# Patient Record
Sex: Female | Born: 1985 | Race: White | Hispanic: No | Marital: Single | Smoking: Current every day smoker
Health system: Southern US, Community
[De-identification: ages and names within clinical notes are randomized; demographics above are authoritative.]

## PROBLEM LIST (undated history)

## (undated) DIAGNOSIS — Z789 Other specified health status: Secondary | ICD-10-CM

## (undated) HISTORY — PX: NO PAST SURGERIES: SHX2092

---
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
## 2010-05-22 NOTE — L&D Delivery Note (Signed)
Delivery Note Pt progressed rapidly to complete dilation and pushed great .  At 7:30 PM a viable female was delivered via Vaginal, Spontaneous Delivery (Presentation: ; Occiput Anterior).  APGAR: 8, 9; weight 4 lb 11.3 oz (2135 g).   Placenta status: Intact, Spontaneous.  Cord: 3 vessels with the following complications: Nuchal x 1 reduced over head.  Placenta sent to path for IUGR baby.  The patient developed a temperature to about 101 in labor so will receive Unasyn for 3 doses.  Anesthesia: Epidural  Episiotomy: None Lacerations: 1st degree;Perineal Suture Repair: 3.0 vicryl rapide Est. Blood Loss (mL): 350cc  Mom to postpartum.  Baby to nursery-stable.  Oliver Pila 05/20/2011, 8:26 PM

## 2010-08-01 ENCOUNTER — Other Ambulatory Visit: Payer: Self-pay | Admitting: Obstetrics and Gynecology

## 2010-08-01 ENCOUNTER — Inpatient Hospital Stay (HOSPITAL_COMMUNITY)
Admission: AD | Admit: 2010-08-01 | Discharge: 2010-08-01 | Disposition: A | Discharge status: Home / Self Care | Payer: Medicaid Other | Source: Ambulatory Visit | Attending: Obstetrics and Gynecology | Admitting: Obstetrics and Gynecology

## 2010-08-01 DIAGNOSIS — O039 Complete or unspecified spontaneous abortion without complication: Secondary | ICD-10-CM | POA: Insufficient documentation

## 2010-08-01 LAB — CBC
MCH: 30 pg (ref 26.0–34.0)
MCHC: 34 g/dL (ref 30.0–36.0)
Platelets: 300 10*3/uL (ref 150–400)
RDW: 12.5 % (ref 11.5–15.5)

## 2011-05-20 ENCOUNTER — Encounter (HOSPITAL_COMMUNITY): Payer: Self-pay | Admitting: Anesthesiology

## 2011-05-20 ENCOUNTER — Inpatient Hospital Stay (HOSPITAL_COMMUNITY)
Admission: AD | Admit: 2011-05-20 | Discharge: 2011-05-22 | DRG: 774 | Disposition: A | Discharge status: Home / Self Care | Payer: Medicaid Other | Source: Ambulatory Visit | Attending: Obstetrics and Gynecology | Admitting: Obstetrics and Gynecology

## 2011-05-20 ENCOUNTER — Encounter (HOSPITAL_COMMUNITY): Payer: Self-pay | Admitting: *Deleted

## 2011-05-20 ENCOUNTER — Inpatient Hospital Stay (HOSPITAL_COMMUNITY): Payer: Medicaid Other | Admitting: Anesthesiology

## 2011-05-20 DIAGNOSIS — O365990 Maternal care for other known or suspected poor fetal growth, unspecified trimester, not applicable or unspecified: Secondary | ICD-10-CM | POA: Diagnosis present

## 2011-05-20 HISTORY — DX: Other specified health status: Z78.9

## 2011-05-20 LAB — RUBELLA ANTIBODY, IGM: Rubella: IMMUNE

## 2011-05-20 LAB — COMPREHENSIVE METABOLIC PANEL
ALT: 7 U/L (ref 0–35)
AST: 12 U/L (ref 0–37)
Albumin: 2.5 g/dL — ABNORMAL LOW (ref 3.5–5.2)
Calcium: 8.7 mg/dL (ref 8.4–10.5)
Chloride: 105 mEq/L (ref 96–112)
Creatinine, Ser: 0.43 mg/dL — ABNORMAL LOW (ref 0.50–1.10)
Sodium: 136 mEq/L (ref 135–145)
Total Bilirubin: 0.2 mg/dL — ABNORMAL LOW (ref 0.3–1.2)

## 2011-05-20 LAB — ABO/RH

## 2011-05-20 LAB — HEPATITIS B SURFACE ANTIGEN: Hepatitis B Surface Ag: NEGATIVE

## 2011-05-20 LAB — CBC
Hemoglobin: 11.5 g/dL — ABNORMAL LOW (ref 12.0–15.0)
MCH: 30.5 pg (ref 26.0–34.0)
MCV: 91 fL (ref 78.0–100.0)
RBC: 3.77 MIL/uL — ABNORMAL LOW (ref 3.87–5.11)

## 2011-05-20 LAB — URIC ACID: Uric Acid, Serum: 5.3 mg/dL (ref 2.4–7.0)

## 2011-05-20 MED ORDER — OXYTOCIN 20 UNITS IN LACTATED RINGERS INFUSION - SIMPLE
125.0000 mL/h | Freq: Once | INTRAVENOUS | Status: AC
  Administered 2011-05-20: 125 mL/h via INTRAVENOUS
  Filled 2011-05-20: qty 1000

## 2011-05-20 MED ORDER — IBUPROFEN 600 MG PO TABS
600.0000 mg | ORAL_TABLET | Freq: Four times a day (QID) | ORAL | Status: DC
  Administered 2011-05-21 – 2011-05-22 (×8): 600 mg via ORAL
  Filled 2011-05-20 (×8): qty 1

## 2011-05-20 MED ORDER — LACTATED RINGERS IV SOLN
500.0000 mL | INTRAVENOUS | Status: DC | PRN
  Administered 2011-05-20: 1000 mL via INTRAVENOUS
  Administered 2011-05-20 (×2): 500 mL via INTRAVENOUS

## 2011-05-20 MED ORDER — SODIUM BICARBONATE 8.4 % IV SOLN
INTRAVENOUS | Status: DC | PRN
  Administered 2011-05-20: 4 mL via EPIDURAL

## 2011-05-20 MED ORDER — SENNOSIDES-DOCUSATE SODIUM 8.6-50 MG PO TABS
2.0000 | ORAL_TABLET | Freq: Every day | ORAL | Status: DC
  Administered 2011-05-21: 2 via ORAL

## 2011-05-20 MED ORDER — LIDOCAINE HCL (PF) 1 % IJ SOLN
30.0000 mL | INTRAMUSCULAR | Status: DC | PRN
  Administered 2011-05-20: 30 mL via SUBCUTANEOUS
  Filled 2011-05-20: qty 30

## 2011-05-20 MED ORDER — LANOLIN HYDROUS EX OINT: TOPICAL_OINTMENT | CUTANEOUS | Status: DC | PRN

## 2011-05-20 MED ORDER — TETANUS-DIPHTH-ACELL PERTUSSIS 5-2.5-18.5 LF-MCG/0.5 IM SUSP
0.5000 mL | Freq: Once | INTRAMUSCULAR | Status: AC
  Administered 2011-05-21: 0.5 mL via INTRAMUSCULAR
  Filled 2011-05-20: qty 0.5

## 2011-05-20 MED ORDER — DIPHENHYDRAMINE HCL 25 MG PO CAPS: 25.0000 mg | ORAL_CAPSULE | Freq: Four times a day (QID) | ORAL | Status: DC | PRN

## 2011-05-20 MED ORDER — ONDANSETRON HCL 4 MG/2ML IJ SOLN: 4.0000 mg | INTRAMUSCULAR | Status: DC | PRN

## 2011-05-20 MED ORDER — OXYCODONE-ACETAMINOPHEN 5-325 MG PO TABS
2.0000 | ORAL_TABLET | ORAL | Status: DC | PRN
  Administered 2011-05-20: 1 via ORAL
  Filled 2011-05-20: qty 1

## 2011-05-20 MED ORDER — ONDANSETRON HCL 4 MG PO TABS: 4.0000 mg | ORAL_TABLET | ORAL | Status: DC | PRN

## 2011-05-20 MED ORDER — OXYTOCIN BOLUS FROM INFUSION
500.0000 mL | Freq: Once | INTRAVENOUS | Status: DC
  Filled 2011-05-20: qty 500

## 2011-05-20 MED ORDER — PHENYLEPHRINE 40 MCG/ML (10ML) SYRINGE FOR IV PUSH (FOR BLOOD PRESSURE SUPPORT): 80.0000 ug | PREFILLED_SYRINGE | INTRAVENOUS | Status: DC | PRN

## 2011-05-20 MED ORDER — FENTANYL 2.5 MCG/ML BUPIVACAINE 1/10 % EPIDURAL INFUSION (WH - ANES)
14.0000 mL/h | INTRAMUSCULAR | Status: DC
  Administered 2011-05-20 (×2): 14 mL/h via EPIDURAL
  Filled 2011-05-20 (×3): qty 60

## 2011-05-20 MED ORDER — DIBUCAINE 1 % RE OINT: 1.0000 "application " | TOPICAL_OINTMENT | RECTAL | Status: DC | PRN

## 2011-05-20 MED ORDER — TERBUTALINE SULFATE 1 MG/ML IJ SOLN: 0.2500 mg | Freq: Once | INTRAMUSCULAR | Status: DC | PRN

## 2011-05-20 MED ORDER — ACETAMINOPHEN 325 MG PO TABS
650.0000 mg | ORAL_TABLET | ORAL | Status: DC | PRN
  Administered 2011-05-20: 650 mg via ORAL
  Filled 2011-05-20: qty 2

## 2011-05-20 MED ORDER — LACTATED RINGERS IV SOLN
INTRAVENOUS | Status: DC
  Administered 2011-05-20: 125 mL/h via INTRAVENOUS
  Administered 2011-05-20: 11:00:00 via INTRAVENOUS
  Administered 2011-05-20: 125 mL/h via INTRAVENOUS

## 2011-05-20 MED ORDER — PHENYLEPHRINE 40 MCG/ML (10ML) SYRINGE FOR IV PUSH (FOR BLOOD PRESSURE SUPPORT)
80.0000 ug | PREFILLED_SYRINGE | INTRAVENOUS | Status: DC | PRN
  Filled 2011-05-20: qty 5

## 2011-05-20 MED ORDER — WITCH HAZEL-GLYCERIN EX PADS: 1.0000 "application " | MEDICATED_PAD | CUTANEOUS | Status: DC | PRN

## 2011-05-20 MED ORDER — EPHEDRINE 5 MG/ML INJ
10.0000 mg | INTRAVENOUS | Status: DC | PRN
  Filled 2011-05-20: qty 4

## 2011-05-20 MED ORDER — BENZOCAINE-MENTHOL 20-0.5 % EX AERO
1.0000 "application " | INHALATION_SPRAY | CUTANEOUS | Status: DC | PRN
  Administered 2011-05-21: 1 via TOPICAL

## 2011-05-20 MED ORDER — EPHEDRINE 5 MG/ML INJ: 10.0000 mg | INTRAVENOUS | Status: DC | PRN

## 2011-05-20 MED ORDER — OXYCODONE-ACETAMINOPHEN 5-325 MG PO TABS
1.0000 | ORAL_TABLET | ORAL | Status: DC | PRN
  Administered 2011-05-22 (×2): 1 via ORAL
  Filled 2011-05-20 (×2): qty 1

## 2011-05-20 MED ORDER — PRENATAL MULTIVITAMIN CH
1.0000 | ORAL_TABLET | Freq: Every day | ORAL | Status: DC
  Administered 2011-05-21 – 2011-05-22 (×2): 1 via ORAL
  Filled 2011-05-20 (×2): qty 1

## 2011-05-20 MED ORDER — ZOLPIDEM TARTRATE 5 MG PO TABS: 5.0000 mg | ORAL_TABLET | Freq: Every evening | ORAL | Status: DC | PRN

## 2011-05-20 MED ORDER — FENTANYL 2.5 MCG/ML BUPIVACAINE 1/10 % EPIDURAL INFUSION (WH - ANES)
INTRAMUSCULAR | Status: DC | PRN
  Administered 2011-05-20: 13 mL/h via EPIDURAL

## 2011-05-20 MED ORDER — OXYTOCIN 20 UNITS IN LACTATED RINGERS INFUSION - SIMPLE
1.0000 m[IU]/min | INTRAVENOUS | Status: DC
  Administered 2011-05-20: 2 m[IU]/min via INTRAVENOUS

## 2011-05-20 MED ORDER — SODIUM CHLORIDE 0.9 % IV SOLN
3.0000 g | Freq: Four times a day (QID) | INTRAVENOUS | Status: AC
  Administered 2011-05-20 – 2011-05-21 (×3): 3 g via INTRAVENOUS
  Filled 2011-05-20 (×3): qty 3

## 2011-05-20 MED ORDER — CITRIC ACID-SODIUM CITRATE 334-500 MG/5ML PO SOLN: 30.0000 mL | ORAL | Status: DC | PRN

## 2011-05-20 MED ORDER — LACTATED RINGERS IV SOLN
500.0000 mL | Freq: Once | INTRAVENOUS | Status: AC
  Administered 2011-05-20: 500 mL via INTRAVENOUS

## 2011-05-20 MED ORDER — SODIUM CHLORIDE 0.9 % IJ SOLN
INTRAMUSCULAR | Status: AC
  Filled 2011-05-20: qty 3

## 2011-05-20 MED ORDER — FLEET ENEMA 7-19 GM/118ML RE ENEM: 1.0000 | ENEMA | RECTAL | Status: DC | PRN

## 2011-05-20 MED ORDER — ONDANSETRON HCL 4 MG/2ML IJ SOLN
4.0000 mg | Freq: Four times a day (QID) | INTRAMUSCULAR | Status: DC | PRN
  Administered 2011-05-20: 4 mg via INTRAVENOUS
  Filled 2011-05-20: qty 2

## 2011-05-20 MED ORDER — SIMETHICONE 80 MG PO CHEW: 80.0000 mg | CHEWABLE_TABLET | ORAL | Status: DC | PRN

## 2011-05-20 MED ORDER — DIPHENHYDRAMINE HCL 50 MG/ML IJ SOLN: 12.5000 mg | INTRAMUSCULAR | Status: DC | PRN

## 2011-05-20 MED ORDER — IBUPROFEN 600 MG PO TABS
600.0000 mg | ORAL_TABLET | Freq: Four times a day (QID) | ORAL | Status: DC | PRN
  Administered 2011-05-20: 600 mg via ORAL
  Filled 2011-05-20: qty 1

## 2011-05-20 NOTE — Progress Notes (Signed)
   Subjective: Uncomfortable but in control  Objective: BP 112/64  Pulse 79  Temp(Src) 97.8 F (36.6 C) (Oral)  Resp 18  Ht 5\' 2"  (1.575 m)  Wt 77.747 kg (171 lb 6.4 oz)  BMI 31.35 kg/m2      FHT:  FHR: 140 bpm, variability: moderate,  accelerations:  Present,  decelerations:  Absent UC:   regular, every 5 minutes SVE:   Dilation: 3 Effacement (%): 80 Station: -1 Exam by:: Dr. Senaida Ores AROM clear  Labs: Lab Results  Component Value Date   WBC 17.0* 05/20/2011   HGB 11.5* 05/20/2011   HCT 34.3* 05/20/2011   MCV 91.0 05/20/2011   PLT 225 05/20/2011    Assessment / Plan: Just made it to L&D, awaiting epidural Will augment with pitocin as needed FHR category I currently. Valerie Villarreal 05/20/2011, 11:58 AM

## 2011-05-20 NOTE — Progress Notes (Signed)
Ok not to restart pitocin

## 2011-05-20 NOTE — Progress Notes (Signed)
G2P0 at 39.2wks. Ctxs since 2000. Ctxs stronger and closer as night has gone by. Some bloody show.

## 2011-05-20 NOTE — Progress Notes (Signed)
   Subjective: Comfortable with epidural  Objective: BP 124/78  Pulse 85  Temp(Src) 98.5 F (36.9 C) (Oral)  Resp 16  Ht 5\' 2"  (1.575 m)  Wt 77.747 kg (171 lb 6.4 oz)  BMI 31.35 kg/m2  SpO2 100%      FHT:  FHR: 135 bpm, variability: moderate,  accelerations:  Present,  decelerations:  Present variable decels with contractions UC:   regular, every 3 minutes SVE:  90/5/0  Labs: Lab Results  Component Value Date   WBC 17.0* 05/20/2011   HGB 11.5* 05/20/2011   HCT 34.3* 05/20/2011   MCV 91.0 05/20/2011   PLT 225 05/20/2011    Assessment / Plan: Pt doing well, beginning to progress and some variable decels noted.  IUPC and FSE placed. Oliver Pila 05/20/2011, 2:53 PM

## 2011-05-20 NOTE — Anesthesia Preprocedure Evaluation (Signed)

## 2011-05-20 NOTE — ED Notes (Deleted)
Specimen sent for pathology as ordered by Lilyan Punt NP

## 2011-05-20 NOTE — Progress Notes (Signed)
FHR accel with scalp stim.

## 2011-05-20 NOTE — Progress Notes (Signed)
Variable to 60s for 20secs. Immed recovery when turned to L side.

## 2011-05-20 NOTE — H&P (Signed)
Valerie Villarreal is a 25 y.o. female G2P0010 at 39+weeks (EDD  05/25/11 by 9week [place]) presenting for regular contractions in early labor to MAU. Pt noted to have some variable decels with contractions, that have improved with a fluid bolus and tracing now is reassuring.  Prenatal care has been otherwise uncomplicated.  Will be admitted and observed with augmentation as needed given the variable decels.  Maternal Medical History:  Reason for admission: Reason for admission: contractions.  Contractions: Onset was 6-12 hours ago.   Frequency: regular.   Perceived severity is moderate.      OB History    Grav Para Term Preterm Abortions TAB SAB Ect Mult Living   2 0 0 0 1 0 1 0 0 1     SAB at 12 weeks  2012  Past Medical History  Diagnosis Date  . No pertinent past medical history    Past Surgical History  Procedure Date  . No past surgeries    Family History: family history is not on file. Social History:  reports that she has been smoking.  She does not have any smokeless tobacco history on file. She reports that she does not drink alcohol or use illicit drugs.  ROS  Dilation: 2.5 Effacement (%): 70 Station: -1 Exam by:: Quintella Baton RNC Blood pressure 125/87, pulse 95, temperature 98.4 F (36.9 C), temperature source Oral, resp. rate 20, height 5\' 2"  (1.575 m), weight 77.747 kg (171 lb 6.4 oz). Maternal Exam:  Uterine Assessment: Contraction strength is moderate.  Contraction frequency is regular.   Abdomen: Patient reports no abdominal tenderness. Fetal presentation: vertex  Introitus: Normal vulva. Normal vagina.    Physical Exam  Constitutional: She is oriented to person, place, and time. She appears well-developed and well-nourished.  Cardiovascular: Normal rate and regular rhythm.   Respiratory: Effort normal and breath sounds normal.  GI: Soft. Bowel sounds are normal.  Genitourinary: Vagina normal and uterus normal.  Neurological: She is alert and oriented to person, place,  and time.  Psychiatric: She has a normal mood and affect. Her behavior is normal.    Prenatal labs: ABO, Rh:  O positive Antibody:  negative Rubella:  immune RPR:   NR HBsAg:   NEG HIV:   NR GBS:   Neg First trimester screen and AFP WNL One hour glucola 120 Assessment/Plan: Pt admitted in early labor, will augment as needed.   Oliver Pila 05/20/2011, 7:49 AM

## 2011-05-20 NOTE — ED Notes (Signed)
4782 Dr Senaida Ores notified of pt's admission and status. Aware of ctx pattern, sve, variables, B/Ps . Will admit to [facility]

## 2011-05-20 NOTE — Progress Notes (Signed)
Pitocin off

## 2011-05-20 NOTE — Progress Notes (Signed)
Dr. Senaida Ores updated on patients status. To give patient Tylenol per orders for fever

## 2011-05-20 NOTE — ED Notes (Signed)
2130 Up to BR.

## 2011-05-20 NOTE — Progress Notes (Signed)
Updated on fetal strip

## 2011-05-20 NOTE — Anesthesia Procedure Notes (Signed)

## 2011-05-21 ENCOUNTER — Encounter (HOSPITAL_COMMUNITY): Payer: Self-pay | Admitting: *Deleted

## 2011-05-21 LAB — CBC
HCT: 28.4 % — ABNORMAL LOW (ref 36.0–46.0)
Hemoglobin: 9.3 g/dL — ABNORMAL LOW (ref 12.0–15.0)
RDW: 14.3 % (ref 11.5–15.5)
WBC: 17.1 10*3/uL — ABNORMAL HIGH (ref 4.0–10.5)

## 2011-05-21 LAB — ABO/RH: ABO/RH(D): O POS

## 2011-05-21 MED ORDER — BENZOCAINE-MENTHOL 20-0.5 % EX AERO
INHALATION_SPRAY | CUTANEOUS | Status: AC
  Administered 2011-05-21: 1 via TOPICAL
  Filled 2011-05-21: qty 56

## 2011-05-21 NOTE — Anesthesia Postprocedure Evaluation (Signed)
  Anesthesia Post-op Note  Patient: Valerie Villarreal  Procedure(s) Performed: * No procedures listed *  Patient Location: Mother/Baby  Anesthesia Type: Epidural  Level of Consciousness: awake, alert  and oriented  Airway and Oxygen Therapy: Patient Spontanous Breathing  Post-op Pain: mild  Post-op Assessment: Patient's Cardiovascular Status Stable, Respiratory Function Stable, Patent Airway, No signs of Nausea or vomiting and Pain level controlled  Post-op Vital Signs: stable  Complications: No apparent anesthesia complications

## 2011-05-21 NOTE — Progress Notes (Signed)
Post Partum Day 1 Subjective: no complaints and tolerating PO  Baby nursing well, has had some temperature issues Objective: Blood pressure 110/71, pulse 93, temperature 98.4 F (36.9 C), temperature source Oral, resp. rate 24, height 5\' 2"  (1.575 m), weight 77.747 kg (171 lb 6.4 oz), SpO2 100.00%.  Physical Exam:  General: alert Lochia: appropriate Uterine Fundus: firm  Basename 05/21/11 0553 05/20/11 0704  HGB 9.3* 11.5*  HCT 28.4* 34.3*    Assessment/Plan: Plan for discharge tomorrow Probably will do circumcision in office Afebrile since delivery, will d/c unasyn after next dose     LOS: 1 day   Advay Volante W 05/21/2011, 6:57 AM

## 2011-05-22 MED ORDER — PNEUMOCOCCAL VAC POLYVALENT 25 MCG/0.5ML IJ INJ: 0.5000 mL | INJECTION | INTRAMUSCULAR | Status: DC

## 2011-05-22 MED ORDER — INFLUENZA VIRUS VACC SPLIT PF IM SUSP
0.5000 mL | INTRAMUSCULAR | Status: AC | PRN
  Administered 2011-05-22: 0.5 mL via INTRAMUSCULAR
  Filled 2011-05-22: qty 0.5

## 2011-05-22 MED ORDER — IBUPROFEN 600 MG PO TABS: 600.0000 mg | ORAL_TABLET | Freq: Four times a day (QID) | ORAL | Status: AC

## 2011-05-22 MED ORDER — OXYCODONE-ACETAMINOPHEN 5-325 MG PO TABS: 1.0000 | ORAL_TABLET | ORAL | Status: AC | PRN

## 2011-05-22 MED ORDER — PNEUMOCOCCAL VAC POLYVALENT 25 MCG/0.5ML IJ INJ
0.5000 mL | INJECTION | INTRAMUSCULAR | Status: AC
  Administered 2011-05-22: 0.5 mL via INTRAMUSCULAR
  Filled 2011-05-22: qty 0.5

## 2011-05-22 NOTE — Progress Notes (Signed)
UR chart review completed.  

## 2011-05-22 NOTE — Discharge Summary (Signed)
Obstetric Discharge Summary Reason for Admission: onset of labor Prenatal Procedures: none Intrapartum Procedures: spontaneous vaginal delivery Postpartum Procedures: none Complications-Operative and Postpartum: first degree perineal laceration Hemoglobin  Date Value Range Status  05/21/2011 9.3* 12.0-15.0 (g/dL) Final     REPEATED TO VERIFY     DELTA CHECK NOTED     HCT  Date Value Range Status  05/21/2011 28.4* 36.0-46.0 (%) Final    Discharge Diagnoses: Term Pregnancy-delivered                                         IUGR Discharge Information: Date: 05/22/2011 Activity: pelvic rest Diet: routine Medications: Ibuprofen and Percocet Condition: improved Instructions: refer to practice specific booklet Discharge to: home   Newborn Data: Live born female  Birth Weight: 4 lb 11.3 oz (2135 g) APGAR: 8, 9  Home with mother.  Valerie Villarreal 05/22/2011, 3:20 AM

## 2011-05-22 NOTE — Progress Notes (Signed)
Post Partum Day 2 Subjective: no complaints, voiding and tolerating PO  Objective: Blood pressure 120/74, pulse 91, temperature 97.7 F (36.5 C), temperature source Oral, resp. rate 20, height 5\' 2"  (1.575 m), weight 77.747 kg (171 lb 6.4 oz), SpO2 100.00%, unknown if currently breastfeeding.  Physical Exam:  General: alert Lochia: appropriate Uterine Fundus: firm }   Basename 05/21/11 0553 05/20/11 0704  HGB 9.3* 11.5*  HCT 28.4* 34.3*    Assessment/Plan: Discharge home Motrin,percocoet F/u 6 weeks Plan circ in office   LOS: 2 days   Dontrey Snellgrove W 05/22/2011, 3:19 AM

## 2011-05-23 ENCOUNTER — Encounter (HOSPITAL_COMMUNITY): Payer: Self-pay | Admitting: *Deleted

## 2011-05-23 ENCOUNTER — Inpatient Hospital Stay (HOSPITAL_COMMUNITY)
Admission: AD | Admit: 2011-05-23 | Discharge: 2011-05-23 | Disposition: A | Discharge status: Home / Self Care | Payer: Medicaid Other | Source: Ambulatory Visit | Attending: Obstetrics and Gynecology | Admitting: Obstetrics and Gynecology

## 2011-05-23 DIAGNOSIS — O864 Pyrexia of unknown origin following delivery: Secondary | ICD-10-CM

## 2011-05-23 LAB — URINE MICROSCOPIC-ADD ON

## 2011-05-23 LAB — CBC
MCV: 92.2 fL (ref 78.0–100.0)
Platelets: 211 10*3/uL (ref 150–400)
RBC: 3.32 MIL/uL — ABNORMAL LOW (ref 3.87–5.11)
WBC: 12.7 10*3/uL — ABNORMAL HIGH (ref 4.0–10.5)

## 2011-05-23 LAB — URINALYSIS, ROUTINE W REFLEX MICROSCOPIC
Bilirubin Urine: NEGATIVE
Nitrite: NEGATIVE
Specific Gravity, Urine: 1.02 (ref 1.005–1.030)
pH: 8.5 — ABNORMAL HIGH (ref 5.0–8.0)

## 2011-05-23 MED ORDER — CEPHALEXIN 500 MG PO CAPS: 500.0000 mg | ORAL_CAPSULE | Freq: Four times a day (QID) | ORAL | Status: AC

## 2011-05-23 MED ORDER — ACETAMINOPHEN 500 MG PO TABS
1000.0000 mg | ORAL_TABLET | Freq: Once | ORAL | Status: AC
  Administered 2011-05-23: 1000 mg via ORAL
  Filled 2011-05-23: qty 2

## 2011-05-23 NOTE — Progress Notes (Signed)
Pt. Was released from the Hospital yesterday as a postpartum pt.  Pt. States that her milk came in tonight at 20.  Her Breasts are sore and she started having chills and a fever at 2000 tonight.  She is having pain in both of her breast also.

## 2011-05-23 NOTE — ED Provider Notes (Signed)
History   Pt presents today c/o fever and breast pain since about 6pm today. She was advised to come to the MAU for evaluation. She states her "mild came in today." She denies erythema, abd pain, or any other sx at this time.  Chief Complaint  Patient presents with  . Fever   HPI  OB History    Grav Para Term Preterm Abortions TAB SAB Ect Mult Living   2 1 1  0 1 0 1 0 0 2      Past Medical History  Diagnosis Date  . No pertinent past medical history     Past Surgical History  Procedure Date  . No past surgeries     No family history on file.  History  Substance Use Topics  . Smoking status: Current Everyday Smoker    Types: Cigarettes  . Smokeless tobacco: Not on file  . Alcohol Use: No    Allergies: No Known Allergies  Prescriptions prior to admission  Medication Sig Dispense Refill  . calcium carbonate (TUMS - DOSED IN MG ELEMENTAL CALCIUM) 500 MG chewable tablet Chew 1 tablet by mouth 2 (two) times daily as needed. For heartburn      . ibuprofen (ADVIL,MOTRIN) 600 MG tablet Take 1 tablet (600 mg total) by mouth every 6 (six) hours.  30 tablet  1  . oxyCODONE-acetaminophen (PERCOCET) 5-325 MG per tablet Take 1-2 tablets by mouth every 3 (three) hours as needed (moderate - severe pain).  30 tablet  0  . Prenatal Vit-Fe Fumarate-FA (PRENATAL MULTIVITAMIN) TABS Take 1 tablet by mouth daily.          Review of Systems  Constitutional: Positive for fever and chills.  Eyes: Negative for blurred vision.  Respiratory: Negative for cough, sputum production, shortness of breath and wheezing.   Cardiovascular: Negative for chest pain and palpitations.  Gastrointestinal: Negative for nausea, vomiting, abdominal pain, diarrhea and constipation.  Genitourinary: Negative for dysuria, urgency, frequency and hematuria.  Neurological: Negative for dizziness and headaches.  Psychiatric/Behavioral: Negative for depression and suicidal ideas.   Physical Exam   Blood pressure  133/78, pulse 131, temperature 101.2 F (38.4 C), temperature source Oral, resp. rate 20, height 5\' 2"  (1.575 m), weight 170 lb 3.2 oz (77.202 kg), unknown if currently breastfeeding.  Physical Exam  Nursing note and vitals reviewed. Constitutional: She is oriented to person, place, and time. She appears well-developed and well-nourished. No distress.  HENT:  Head: Normocephalic and atraumatic.  Eyes: EOM are normal. Pupils are equal, round, and reactive to light.  Cardiovascular: Normal rate, regular rhythm and normal heart sounds.  Exam reveals no gallop and no friction rub.   No murmur heard. Respiratory: Effort normal and breath sounds normal. No respiratory distress. She has no wheezes. She has no rales. She exhibits no tenderness.  GI: Soft. She exhibits no distension. There is no tenderness. There is no rebound and no guarding.  Neurological: She is alert and oriented to person, place, and time.  Skin: Skin is warm and dry. She is not diaphoretic.  Psychiatric: She has a normal mood and affect. Her behavior is normal. Judgment and thought content normal.   Breast are symmetrical and engorged. No erythema. MAU Course  Procedures  Results for orders placed during the hospital encounter of 05/23/11 (from the past 24 hour(s))  CBC     Status: Abnormal   Collection Time   05/23/11  9:10 PM      Component Value Range   WBC  12.7 (*) 4.0 - 10.5 (K/uL)   RBC 3.32 (*) 3.87 - 5.11 (MIL/uL)   Hemoglobin 9.9 (*) 12.0 - 15.0 (g/dL)   HCT 16.1 (*) 09.6 - 46.0 (%)   MCV 92.2  78.0 - 100.0 (fL)   MCH 29.8  26.0 - 34.0 (pg)   MCHC 32.4  30.0 - 36.0 (g/dL)   RDW 04.5  40.9 - 81.1 (%)   Platelets 211  150 - 400 (K/uL)  URINALYSIS, ROUTINE W REFLEX MICROSCOPIC     Status: Abnormal   Collection Time   05/23/11  9:40 PM      Component Value Range   Color, Urine RED (*) YELLOW    APPearance CLOUDY (*) CLEAR    Specific Gravity, Urine 1.020  1.005 - 1.030    pH 8.5 (*) 5.0 - 8.0    Glucose, UA  NEGATIVE  NEGATIVE (mg/dL)   Hgb urine dipstick LARGE (*) NEGATIVE    Bilirubin Urine NEGATIVE  NEGATIVE    Ketones, ur NEGATIVE  NEGATIVE (mg/dL)   Protein, ur 914 (*) NEGATIVE (mg/dL)   Urobilinogen, UA 0.2  0.0 - 1.0 (mg/dL)   Nitrite NEGATIVE  NEGATIVE    Leukocytes, UA MODERATE (*) NEGATIVE   URINE MICROSCOPIC-ADD ON     Status: Abnormal   Collection Time   05/23/11  9:40 PM      Component Value Range   Squamous Epithelial / LPF MANY (*) RARE    WBC, UA 21-50  <3 (WBC/hpf)   RBC / HPF TOO NUMEROUS TO COUNT  <3 (RBC/hpf)   Bacteria, UA FEW (*) RARE    Urine-Other MUCOUS PRESENT     Urine sent for culture.  Discussed pt with Dr. Ellyn Hack. Pt is to call in the am for an appt this week in the office.  Assessment and Plan  PP fever: discussed with pt at length. Most likely result of breast engorgement and possible UTI. Will have pt alternate motrin and tylenol. She is to continue to breast feed. Will also give Rx for keflex. She is to return immediately if her sx worsen or change. Discussed diet, activity, risks, and precautions.   Clinton Gallant. Yulian Gosney III, DrHSc, MPAS, PA-C  05/23/2011, 10:03 PM   Henrietta Hoover, PA 05/23/11 2237

## 2011-05-23 NOTE — Progress Notes (Signed)
Pt postpartum vag del 12/29, breastfeeding, fever and cold symptoms since 1800.

## 2011-05-24 LAB — URINE CULTURE: Colony Count: 9000

## 2011-05-31 ENCOUNTER — Inpatient Hospital Stay (HOSPITAL_COMMUNITY): Admission: RE | Admit: 2011-05-31 | Payer: Self-pay | Source: Ambulatory Visit

## 2014-03-23 ENCOUNTER — Encounter (HOSPITAL_COMMUNITY): Payer: Self-pay | Admitting: *Deleted

## 2016-03-20 ENCOUNTER — Encounter (HOSPITAL_BASED_OUTPATIENT_CLINIC_OR_DEPARTMENT_OTHER): Payer: Self-pay | Admitting: Emergency Medicine

## 2016-03-20 ENCOUNTER — Emergency Department (HOSPITAL_BASED_OUTPATIENT_CLINIC_OR_DEPARTMENT_OTHER)
Admission: EM | Admit: 2016-03-20 | Discharge: 2016-03-20 | Disposition: A | Discharge status: Home / Self Care | Payer: Worker's Compensation | Attending: Emergency Medicine | Admitting: Emergency Medicine

## 2016-03-20 DIAGNOSIS — Y990 Civilian activity done for income or pay: Secondary | ICD-10-CM | POA: Insufficient documentation

## 2016-03-20 DIAGNOSIS — Y939 Activity, unspecified: Secondary | ICD-10-CM | POA: Insufficient documentation

## 2016-03-20 DIAGNOSIS — Y929 Unspecified place or not applicable: Secondary | ICD-10-CM | POA: Diagnosis not present

## 2016-03-20 DIAGNOSIS — S61211A Laceration without foreign body of left index finger without damage to nail, initial encounter: Secondary | ICD-10-CM | POA: Diagnosis present

## 2016-03-20 DIAGNOSIS — W268XXA Contact with other sharp object(s), not elsewhere classified, initial encounter: Secondary | ICD-10-CM | POA: Diagnosis not present

## 2016-03-20 DIAGNOSIS — Z23 Encounter for immunization: Secondary | ICD-10-CM | POA: Insufficient documentation

## 2016-03-20 DIAGNOSIS — F17210 Nicotine dependence, cigarettes, uncomplicated: Secondary | ICD-10-CM | POA: Insufficient documentation

## 2016-03-20 MED ORDER — LIDOCAINE HCL (PF) 1 % IJ SOLN
10.0000 mL | Freq: Once | INTRAMUSCULAR | Status: AC
  Administered 2016-03-20: 10 mL
  Filled 2016-03-20: qty 10

## 2016-03-20 MED ORDER — TETANUS-DIPHTH-ACELL PERTUSSIS 5-2.5-18.5 LF-MCG/0.5 IM SUSP
0.5000 mL | Freq: Once | INTRAMUSCULAR | Status: AC
  Administered 2016-03-20: 0.5 mL via INTRAMUSCULAR
  Filled 2016-03-20: qty 0.5

## 2016-03-20 NOTE — ED Triage Notes (Addendum)
Pt cut LT hand with box cutter this a.m.

## 2016-03-20 NOTE — ED Provider Notes (Signed)
[facility] Note   CSN: [id number] Arrival date & time: 03/20/16  1048      History   Chief Complaint Chief Complaint  Patient presents with  . Laceration    HPI Valerie Villarreal is a 30 y.o. female with laceration to dorsal aspect of MCP of left index finger due to a box cutter incident while at work this morning.  She was wearing cloth gloves at the time of incident, the box cutter was brand new.  She denies obvious foreign bodies in injury site, numbness, tingling, decreased range of motion at distal index finger/joints.  She does not know her tetanus status.  Denies allergies to lidocaine and iodine.   HPI  Past Medical History:  Diagnosis Date  . No pertinent past medical history     There are no active problems to display for this patient.   Past Surgical History:  Procedure Laterality Date  . NO PAST SURGERIES      OB History    Gravida Para Term Preterm AB Living   2 1 1  0 1 2   SAB TAB Ectopic Multiple Live Births   1 0 0 0 1       Home Medications    Prior to Admission medications   Medication Sig Start Date End Date Taking? Authorizing Provider  calcium carbonate (TUMS - DOSED IN MG ELEMENTAL CALCIUM) 500 MG chewable tablet Chew 1 tablet by mouth 2 (two) times daily as needed. For heartburn    Historical Provider, MD  Prenatal Vit-Fe Fumarate-FA (PRENATAL MULTIVITAMIN) TABS Take 1 tablet by mouth daily.      Historical Provider, MD    Family History No family history on file.  Social History Social History  Substance Use Topics  . Smoking status: Current Every Day Smoker    Packs/day: 0.50    Types: Cigarettes  . Smokeless tobacco: Never Used  . Alcohol use Yes     Comment: occasionally     Allergies   Review of patient's allergies indicates no known allergies.   Review of Systems Review of Systems  Constitutional: Negative.   HENT: Negative.   Eyes: Negative.   Respiratory: Negative.   Cardiovascular: Negative.    Gastrointestinal: Negative.   Endocrine: Negative.   Genitourinary: Negative.   Musculoskeletal: Negative.   Skin: Positive for wound (6 cm laceration with smooth edges on dorsal aspect of MCP of left index digit without visiable foreign bodies or debris).  Neurological:       No tingling, numbness or weakness of L index distal to injury.   Psychiatric/Behavioral: Negative.      Physical Exam Updated Vital Signs BP 100/71 (BP Location: Right Arm)   Pulse 74   Temp 97.8 F (36.6 C) (Oral)   Resp 18   Ht 5\' 2"  (1.575 m)   Wt 56.7 kg   SpO2 97%   Breastfeeding? No   BMI 22.86 kg/m   Physical Exam  Constitutional: She is oriented to person, place, and time. She appears well-developed and well-nourished.  HENT:  Head: Normocephalic and atraumatic.  Cardiovascular: Normal rate, regular rhythm, normal heart sounds and intact distal pulses.   Pulmonary/Chest: Effort normal and breath sounds normal.  Abdominal: Soft. Bowel sounds are normal.  Neurological: She is alert and oriented to person, place, and time.  Finger abduction/adduction intact bilaterally.  L index flexion/extension intact at DIP, PIP and MCP.  L wrist ROM intact.  Sensation to light touch intact at all 10 digits  including L index finger distal to injury.  Grip and wrist strength intact bilaterally.   Skin: Skin is warm and dry.  6 cm laceration with smooth edges on dorsal aspect of MCP of left index digit without foreign bodies or debris.   Psychiatric: She has a normal mood and affect.     ED Treatments / Results  Labs (all labs ordered are listed, but only abnormal results are displayed) Labs Reviewed - No data to display  EKG  EKG Interpretation None       Radiology No results found.  Procedures Procedures (including critical care time)  LACERATION REPAIR Performed by: Liberty Handylaudia J Cadden Elizondo Authorized by: Liberty Handylaudia J Curt Oatis Consent: Verbal consent obtained. Risks and benefits: risks, benefits  and alternatives were discussed Consent given by: patient Patient identity confirmed: provided demographic data Prepped and Draped in normal sterile fashion Wound explored  Laceration Location: dorsal aspect of MCP joint of 2nd digit (index), left  Laceration Length: 6 cm  No Foreign Bodies seen or palpated  Anesthesia: local infiltration  Local anesthetic: lidocaine 1% without epinephrine within wound edges  Anesthetic total: 10 ml  Irrigation method: syringe Amount of cleaning: standard  Skin closure: single interrupted 5-0 non-absorbable ethylon  Number of sutures: 7  Patient tolerance: Patient tolerated the procedure well with no immediate complications.  Medications Ordered in ED Medications  lidocaine (PF) (XYLOCAINE) 1 % injection 10 mL (10 mLs Infiltration Given by Other 03/20/16 1139)  Tdap (BOOSTRIX) injection 0.5 mL (0.5 mLs Intramuscular Given 03/20/16 1140)     Initial Impression / Assessment and Plan / ED Course  I have reviewed the triage vital signs and the nursing notes.  Pertinent labs & imaging results that were available during my care of the patient were reviewed by me and considered in my medical decision making (see chart for details).  Clinical Course  Comment By Time  Laceration repair completed, pt tolerated procedure well with minimal pain and bleeding.  Pt would like dressing to be mobile so she can work.    Liberty HandyClaudia J Cainen Burnham, PA-C 10/30 1349   30 y.o healthy female here for repair of 6cm laceration over L index MCP due to incident at work with a clean, brand new box cutter.  Mechanism of injury low risk for foreign bodies/debris.  On exam wound had smooth clean edges, ROM and neuro exam intact distally and proximally site of injury.  Did not order hand/digit x-ray due to low risk mechanism of injury, no h/o foreign body and intact neuro exam.  Pt tolerated lac repair without issues.  No tendon injury noted upon wound exploration.  I personally  bandaged the pt- she requested a flexible wrap so she would be less limited at work.   Splint provided by rn to secure bandage. Brisk capillary refill and sensation intact distal to injury post lac repair and bandage.  Pt denied work note and wishes to return to work today, pt had no further questions or concerns at time of discharge.  Instructed pt to f/u with PCP for suture removal in 2 weeks and keep wound clean and dry during work.  Pt instructed to take tylenol for pain control, no need for antibiotics at this time.  Provided return to ED instructions and education on wound care, pain management and signs of infection to pt.   Final Clinical Impressions(s) / ED Diagnoses   Final diagnoses:  Laceration of left index finger without foreign body without damage to nail, initial encounter  New Prescriptions Discharge Medication List as of 03/20/2016  1:55 PM       Liberty HandyClaudia J Quincy Boy, PA-C 03/20/16 2103    Arby BarretteMarcy Pfeiffer, MD 03/21/16 1022

## 2016-03-20 NOTE — ED Notes (Signed)
Work note faxed to [phone] per pt request

## 2016-04-04 ENCOUNTER — Encounter (HOSPITAL_BASED_OUTPATIENT_CLINIC_OR_DEPARTMENT_OTHER): Payer: Self-pay

## 2016-04-04 ENCOUNTER — Emergency Department (HOSPITAL_BASED_OUTPATIENT_CLINIC_OR_DEPARTMENT_OTHER)
Admission: EM | Admit: 2016-04-04 | Discharge: 2016-04-04 | Disposition: A | Discharge status: Home / Self Care | Payer: Worker's Compensation | Attending: Emergency Medicine | Admitting: Emergency Medicine

## 2016-04-04 DIAGNOSIS — F17210 Nicotine dependence, cigarettes, uncomplicated: Secondary | ICD-10-CM | POA: Diagnosis not present

## 2016-04-04 DIAGNOSIS — Z4802 Encounter for removal of sutures: Secondary | ICD-10-CM | POA: Insufficient documentation

## 2016-04-04 NOTE — ED Notes (Signed)
Pt verbalizes understanding of d/c instructions and denies any further needs at this time. 

## 2016-04-04 NOTE — ED Provider Notes (Signed)
[facility] Provider [facility]   CSN: [id number] Arrival date & time: 04/04/16  1936     History   Chief Complaint Chief Complaint  Patient presents with  . Suture / Staple Removal    HPI Valerie Villarreal is a 30 y.o. female.  The history is provided by the patient and medical records.     30 year old female here for suture removal. Sutures were placed here 2 weeks ago after she actually cut her finger with a box cutter blade at work. States wound has been healing well. No fever or drainage. Her tetanus is up-to-date currently.  Past Medical History:  Diagnosis Date  . No pertinent past medical history     There are no active problems to display for this patient.   Past Surgical History:  Procedure Laterality Date  . NO PAST SURGERIES      OB History    Gravida Para Term Preterm AB Living   2 1 1  0 1 2   SAB TAB Ectopic Multiple Live Births   1 0 0 0 1       Home Medications    Prior to Admission medications   Medication Sig Start Date End Date Taking? Authorizing Provider  calcium carbonate (TUMS - DOSED IN MG ELEMENTAL CALCIUM) 500 MG chewable tablet Chew 1 tablet by mouth 2 (two) times daily as needed. For heartburn    Historical Provider, MD  Prenatal Vit-Fe Fumarate-FA (PRENATAL MULTIVITAMIN) TABS Take 1 tablet by mouth daily.      Historical Provider, MD    Family History No family history on file.  Social History Social History  Substance Use Topics  . Smoking status: Current Every Day Smoker    Packs/day: 0.50    Types: Cigarettes  . Smokeless tobacco: Never Used  . Alcohol use Yes     Comment: occasionally     Allergies   Patient has no known allergies.   Review of Systems Review of Systems  Skin: Positive for wound.  All other systems reviewed and are negative.    Physical Exam Updated Vital Signs BP 111/72   Pulse 94   Temp 98.2 F (36.8 C) (Oral)   Resp 18   SpO2 97%   Physical Exam  Constitutional: She is  oriented to person, place, and time. She appears well-developed and well-nourished.  HENT:  Head: Normocephalic and atraumatic.  Mouth/Throat: Oropharynx is clear and moist.  Eyes: Conjunctivae and EOM are normal. Pupils are equal, round, and reactive to light.  Neck: Normal range of motion.  Cardiovascular: Normal rate, regular rhythm and normal heart sounds.   Pulmonary/Chest: Effort normal and breath sounds normal.  Abdominal: Soft. Bowel sounds are normal.  Musculoskeletal: Normal range of motion.  Left index finger with 7 intact vicryl sutures noted along dorsal surface; no significant redness or drainage; full ROM of finger maintained  Neurological: She is alert and oriented to person, place, and time.  Skin: Skin is warm and dry.  Psychiatric: She has a normal mood and affect.  Nursing note and vitals reviewed.    ED Treatments / Results  Labs (all labs ordered are listed, but only abnormal results are displayed) Labs Reviewed - No data to display  EKG  EKG Interpretation None       Radiology No results found.  Procedures .Suture Removal Date/Time: 04/04/2016 8:01 PM Performed by: Garlon HatchetSANDERS, Yuna Pizzolato M Authorized by: Garlon HatchetSANDERS, Dianne Whelchel M   Consent:    Consent obtained:  Verbal   Consent  given by:  Patient   Risks discussed:  Bleeding, pain and wound separation   Alternatives discussed:  No treatment Location:    Location:  Upper extremity   Upper extremity location:  Hand   Hand location:  R index finger Procedure details:    Wound appearance:  No signs of infection   Number of sutures removed:  7 Post-procedure details:    Post-removal:  Antibiotic ointment applied and dressing applied   Patient tolerance of procedure:  Tolerated well, no immediate complications   (including critical care time)  Medications Ordered in ED Medications - No data to display   Initial Impression / Assessment and Plan / ED Course  I have reviewed the triage vital signs and the  nursing notes.  Pertinent labs & imaging results that were available during my care of the patient were reviewed by me and considered in my medical decision making (see chart for details).  Clinical Course    30 y.o. female here for suture removal. Wound is clean without any signs of infection. Sutures removed without difficulty, patient tolerated well. Discussed continued wound care. Follow-up with PCP if any other concerns.  Discussed plan with patient, he/she acknowledged understanding and agreed with plan of care.  Return precautions given for new or worsening symptoms.  Final Clinical Impressions(s) / ED Diagnoses   Final diagnoses:  Visit for suture removal    New Prescriptions New Prescriptions   No medications on file     Garlon HatchetLisa M Lyndsie Wallman, Cordelia Poche-C 04/04/16 2004    Rolan BuccoMelanie Belfi, MD 04/04/16 [phone]

## 2016-04-04 NOTE — Discharge Instructions (Signed)
May continue use neosporin for the next few days.  May use mederma to help reduce scarring. Follow-up with your primary care doctor.

## 2016-04-04 NOTE — ED Triage Notes (Signed)
Pt needs sutures removed to lt 1st digit, no redness, no swelling noted

## 2018-07-24 ENCOUNTER — Emergency Department (HOSPITAL_BASED_OUTPATIENT_CLINIC_OR_DEPARTMENT_OTHER)
Admission: EM | Admit: 2018-07-24 | Discharge: 2018-07-24 | Disposition: A | Discharge status: Home / Self Care | Payer: Worker's Compensation | Attending: Emergency Medicine | Admitting: Emergency Medicine

## 2018-07-24 ENCOUNTER — Encounter (HOSPITAL_BASED_OUTPATIENT_CLINIC_OR_DEPARTMENT_OTHER): Payer: Self-pay

## 2018-07-24 ENCOUNTER — Other Ambulatory Visit: Payer: Self-pay

## 2018-07-24 ENCOUNTER — Emergency Department (HOSPITAL_BASED_OUTPATIENT_CLINIC_OR_DEPARTMENT_OTHER): Payer: Worker's Compensation

## 2018-07-24 DIAGNOSIS — Y929 Unspecified place or not applicable: Secondary | ICD-10-CM | POA: Diagnosis not present

## 2018-07-24 DIAGNOSIS — Y939 Activity, unspecified: Secondary | ICD-10-CM | POA: Insufficient documentation

## 2018-07-24 DIAGNOSIS — Y990 Civilian activity done for income or pay: Secondary | ICD-10-CM | POA: Insufficient documentation

## 2018-07-24 DIAGNOSIS — W010XXA Fall on same level from slipping, tripping and stumbling without subsequent striking against object, initial encounter: Secondary | ICD-10-CM | POA: Diagnosis not present

## 2018-07-24 DIAGNOSIS — S59911A Unspecified injury of right forearm, initial encounter: Secondary | ICD-10-CM | POA: Diagnosis present

## 2018-07-24 DIAGNOSIS — F17210 Nicotine dependence, cigarettes, uncomplicated: Secondary | ICD-10-CM | POA: Insufficient documentation

## 2018-07-24 DIAGNOSIS — S52124A Nondisplaced fracture of head of right radius, initial encounter for closed fracture: Secondary | ICD-10-CM | POA: Diagnosis not present

## 2018-07-24 NOTE — ED Triage Notes (Signed)
Pt states she fell/caught self-pain to left UE from elbow to wrist-NAD-steady gait

## 2018-07-24 NOTE — ED Provider Notes (Signed)
[facility] EMERGENCY DEPARTMENT Provider Note   CSN: [id number] Arrival date & time: 07/24/18  1400    History   Chief Complaint Chief Complaint  Patient presents with  . Arm Injury    HPI Valerie Villarreal is a 33 y.o. female.     Patient is a 33 year old female who presents with right arm pain.  She had a fall on outstretched hand earlier at work today and has been having pain throughout her right forearm since that time.  She states she does not have particular pain in her wrist but when she moves her wrist it hurts in her forearm.  She denies any other injuries.  No numbness or weakness of the hand.     Past Medical History:  Diagnosis Date  . No pertinent past medical history     There are no active problems to display for this patient.   Past Surgical History:  Procedure Laterality Date  . NO PAST SURGERIES       OB History    Gravida  2   Para  1   Term  1   Preterm  0   AB  1   Living  2     SAB  1   TAB  0   Ectopic  0   Multiple  0   Live Births  1            Home Medications    Prior to Admission medications   Medication Sig Start Date End Date Taking? Authorizing Provider  calcium carbonate (TUMS - DOSED IN MG ELEMENTAL CALCIUM) 500 MG chewable tablet Chew 1 tablet by mouth 2 (two) times daily as needed. For heartburn    [provider]  Prenatal Vit-Fe Fumarate-FA (PRENATAL MULTIVITAMIN) TABS Take 1 tablet by mouth daily.      [provider]    Family History No family history on file.  Social History Social History   Tobacco Use  . Smoking status: Current Every Day Smoker    Packs/day: 0.50    Types: Cigarettes  . Smokeless tobacco: Never Used  Substance Use Topics  . Alcohol use: Yes    Comment: occasionally  . Drug use: No     Allergies   Patient has no known allergies.   Review of Systems Review of Systems  Constitutional: Negative for fever.  Gastrointestinal: Negative for  nausea and vomiting.  Musculoskeletal: Positive for arthralgias. Negative for back pain, joint swelling and neck pain.  Skin: Negative for wound.  Neurological: Negative for weakness, numbness and headaches.     Physical Exam Updated Vital Signs BP 120/89 (BP Location: Left Arm)   Pulse 96   Temp 98.5 F (36.9 C) (Oral)   Resp 18   Ht 5\' 2"  (1.575 m)   Wt 65.8 kg   SpO2 99%   BMI 26.52 kg/m   Physical Exam Constitutional:      Appearance: She is well-developed.  HENT:     Head: Normocephalic and atraumatic.  Neck:     Musculoskeletal: Normal range of motion and neck supple.  Cardiovascular:     Rate and Rhythm: Normal rate.  Pulmonary:     Effort: Pulmonary effort is normal.  Musculoskeletal:        General: Tenderness present.     Comments: Patient has tenderness on palpation of her right midforearm, mostly on the ulnar side.  There is no specific bony tenderness to the wrist or the elbow.  She  has normal sensation and motor function in the right hand.  Radial pulses are intact.  Skin:    General: Skin is warm and dry.  Neurological:     Mental Status: She is alert and oriented to person, place, and time.      ED Treatments / Results  Labs (all labs ordered are listed, but only abnormal results are displayed) Labs Reviewed - No data to display  EKG None  Radiology Dg Forearm Right  Result Date: 07/24/2018 CLINICAL DATA:  Right forearm pain and swelling after fall. EXAM: RIGHT FOREARM - 2 VIEW COMPARISON:  None. FINDINGS: A nondisplaced centrally located radial head fracture with anterior elevation of the fat pad consistent with an elbow joint effusion is noted. No joint dislocation is seen. The ulna appears intact. There is mild dorsal soft tissue swelling of the mid forearm. The included carpal bones are intact. IMPRESSION: Mason category I nondisplaced radial head fracture with associated elbow joint effusion. Electronically Signed   By: Tollie Eth M.D.   On:  07/24/2018 15:33    Procedures Procedures (including critical care time)  Medications Ordered in ED Medications - No data to display   Initial Impression / Assessment and Plan / ED Course  I have reviewed the triage vital signs and the nursing notes.  Pertinent labs & imaging results that were available during my care of the patient were reviewed by me and considered in my medical decision making (see chart for details).        X-rays reveal a radial head fracture, nondisplaced.  She was placed in a sugar tong splint and a sling.  She was advised to follow-up with orthopedics and was given a referral to follow-up with [facility].  She will use ibuprofen or Tylenol for pain.  Final Clinical Impressions(s) / ED Diagnoses   Final diagnoses:  Closed nondisplaced fracture of head of right radius, initial encounter    ED Discharge Orders    None       Rolan Bucco, MD 07/24/18 1545

## 2018-07-26 ENCOUNTER — Ambulatory Visit (INDEPENDENT_AMBULATORY_CARE_PROVIDER_SITE_OTHER): Payer: Worker's Compensation

## 2018-07-26 ENCOUNTER — Encounter (INDEPENDENT_AMBULATORY_CARE_PROVIDER_SITE_OTHER): Payer: Self-pay | Admitting: Orthopaedic Surgery

## 2018-07-26 ENCOUNTER — Ambulatory Visit (INDEPENDENT_AMBULATORY_CARE_PROVIDER_SITE_OTHER): Payer: Worker's Compensation | Admitting: Orthopaedic Surgery

## 2018-07-26 DIAGNOSIS — S52124A Nondisplaced fracture of head of right radius, initial encounter for closed fracture: Secondary | ICD-10-CM | POA: Diagnosis not present

## 2018-07-26 NOTE — Progress Notes (Signed)
Office Visit Note   Patient: Valerie Villarreal           Date of Birth: 07-Aug-1985           MRN: [id number] Visit Date: 07/26/2018              Requested by: Huel Cote, MD [address] [place], [place] [address] PCP: Huel Cote, MD   Assessment & Plan: Visit Diagnoses:  1. Closed nondisplaced fracture of head of right radius, initial encounter     Plan: Impression is right nondisplaced radial head fracture.  We will place the patient in a sling.  She will be nonweightbearing.  She will ice and elevate for pain and swelling.  Follow-up with [place] in 3 weeks time for repeat evaluation three-view x-rays of the right elbow.  Follow-Up Instructions: Return in about 3 weeks (around 08/16/2018).   Orders:  Orders Placed This Encounter  Procedures  . XR Humerus Right   No orders of the defined types were placed in this encounter.     Procedures: No procedures performed   Clinical Data: No additional findings.   Subjective: Chief Complaint  Patient presents with  . Right Elbow - Pain    HPI patient is a pleasant 33 year old female who presents our clinic today following an injury to her right elbow.  While at work on 07/24/2018, she tripped over a pallet landing on her right side.  She is unsure whether she landed on her hand or the elbow.  She was seen in the ED where x-rays were obtained.  These showed a nondisplaced fracture to the radial head.  She was placed in a long-arm splint.  She comes in today for further evaluation treatment recommendation.  The pain she has primarily to the radial head.  Now that her splint has been taken off, she is having marked pain to the distal humerus.  Pain is worse with any movement of the right upper extremity.  She has been taking ibuprofen with moderate relief of symptoms.  No numbness, tingling or burning.  Review of Systems as detailed in HPI.  All others reviewed and are negative.   Objective: Vital Signs: There were no  vitals taken for this visit.  Physical Exam well-developed and well-nourished female in no acute distress.  Alert and oriented x3.  Ortho Exam examination of the right elbow reveals mild swelling.  No ecchymosis.  Marked tenderness the radial head.  She does have moderate tenderness the distal humerus.  She is neurovascularly intact distally.  Specialty Comments:  No specialty comments available.  Imaging: Xr Humerus Right  Result Date: 07/26/2018 No acute or structural abnormalities    PMFS History: Patient Active Problem List   Diagnosis Date Noted  . Closed nondisplaced fracture of head of right radius 07/26/2018   Past Medical History:  Diagnosis Date  . No pertinent past medical history     History reviewed. No pertinent family history.  Past Surgical History:  Procedure Laterality Date  . NO PAST SURGERIES     Social History   Occupational History  . Not on file  Tobacco Use  . Smoking status: Current Every Day Smoker    Packs/day: 0.50    Types: Cigarettes  . Smokeless tobacco: Never Used  Substance and Sexual Activity  . Alcohol use: Yes    Comment: occasionally  . Drug use: No  . Sexual activity: Yes    Birth control/protection: I.U.D.

## 2018-08-13 ENCOUNTER — Telehealth (INDEPENDENT_AMBULATORY_CARE_PROVIDER_SITE_OTHER): Payer: Self-pay

## 2018-08-13 NOTE — Telephone Encounter (Signed)
LMOM to return call. Need to R/s appt to tues/thursday 8am-2pm(xu schedule).  Patient's number not working.

## 2018-08-16 ENCOUNTER — Ambulatory Visit (INDEPENDENT_AMBULATORY_CARE_PROVIDER_SITE_OTHER): Payer: Self-pay | Admitting: Orthopaedic Surgery

## 2018-08-20 ENCOUNTER — Encounter (INDEPENDENT_AMBULATORY_CARE_PROVIDER_SITE_OTHER): Payer: Self-pay | Admitting: Orthopaedic Surgery

## 2018-08-20 ENCOUNTER — Ambulatory Visit (INDEPENDENT_AMBULATORY_CARE_PROVIDER_SITE_OTHER): Payer: Worker's Compensation

## 2018-08-20 ENCOUNTER — Other Ambulatory Visit: Payer: Self-pay

## 2018-08-20 ENCOUNTER — Ambulatory Visit (INDEPENDENT_AMBULATORY_CARE_PROVIDER_SITE_OTHER): Payer: Worker's Compensation | Admitting: Orthopaedic Surgery

## 2018-08-20 DIAGNOSIS — S52124D Nondisplaced fracture of head of right radius, subsequent encounter for closed fracture with routine healing: Secondary | ICD-10-CM | POA: Diagnosis not present

## 2018-08-20 NOTE — Progress Notes (Signed)
Office Visit Note   Patient: Valerie Villarreal           Date of Birth: 1986-03-28           MRN: [id number] Visit Date: 08/20/2018              Requested by: Valerie Cote, MD [address] [place], [place] [address] PCP: Valerie Cote, MD   Assessment & Plan: Visit Diagnoses:  1. Closed nondisplaced fracture of head of right radius with routine healing, subsequent encounter     Plan: Impression is healing right radial head fracture that is approximately 4 weeks from date of injury.  At this point we will begin occupational therapy for range of motion strengthening and weight-bear as tolerated.  She is still limited to light duty for the next 4 weeks.  We will recheck her at that time with 2 view x-rays of the right elbow.  Anticipate releasing her at that time if she is doing well.  Follow-Up Instructions: Return in about 4 weeks (around 09/17/2018).   Orders:  Orders Placed This Encounter  Procedures  . XR Elbow Complete Right (3+View)   No orders of the defined types were placed in this encounter.     Procedures: No procedures performed   Clinical Data: No additional findings.   Subjective: Chief Complaint  Patient presents with  . Right Elbow - Pain    Valerie Villarreal comes back today for follow-up of her minimally displaced right radial head fracture.  She is doing well overall.  She has been on light duty and doing well from this.  Denies any significant pain.  She does endorse some stiffness with elbow extension.   Review of Systems  Constitutional: Negative.   HENT: Negative.   Eyes: Negative.   Respiratory: Negative.   Cardiovascular: Negative.   Endocrine: Negative.   Musculoskeletal: Negative.   Neurological: Negative.   Hematological: Negative.   Psychiatric/Behavioral: Negative.   All other systems reviewed and are negative.    Objective: Vital Signs: There were no vitals taken for this visit.  Physical Exam Vitals signs and nursing note  reviewed.  Constitutional:      Appearance: She is well-developed.  Pulmonary:     Effort: Pulmonary effort is normal.  Skin:    General: Skin is warm.     Capillary Refill: Capillary refill takes less than 2 seconds.  Neurological:     Mental Status: She is alert and oriented to person, place, and time.  Psychiatric:        Behavior: Behavior normal.        Thought Content: Thought content normal.        Judgment: Judgment normal.     Ortho Exam Right elbow exam shows improving range of motion with flexion and extension.  She has full pronation and supination.  No significant tenderness of the radial head. Specialty Comments:  No specialty comments available.  Imaging: Xr Elbow Complete Right (3+view)  Result Date: 08/20/2018 Healing radial head fracture without complication    PMFS History: Patient Active Problem List   Diagnosis Date Noted  . Closed nondisplaced fracture of head of right radius 07/26/2018   Past Medical History:  Diagnosis Date  . No pertinent past medical history     History reviewed. No pertinent family history.  Past Surgical History:  Procedure Laterality Date  . NO PAST SURGERIES     Social History   Occupational History  . Not on file  Tobacco  Use  . Smoking status: Current Every Day Smoker    Packs/day: 0.50    Types: Cigarettes  . Smokeless tobacco: Never Used  Substance and Sexual Activity  . Alcohol use: Yes    Comment: occasionally  . Drug use: No  . Sexual activity: Yes    Birth control/protection: I.U.D.

## 2018-09-30 ENCOUNTER — Other Ambulatory Visit: Payer: Self-pay

## 2018-09-30 ENCOUNTER — Ambulatory Visit (INDEPENDENT_AMBULATORY_CARE_PROVIDER_SITE_OTHER): Payer: Worker's Compensation | Admitting: Orthopaedic Surgery

## 2018-09-30 ENCOUNTER — Encounter: Payer: Self-pay | Admitting: Orthopaedic Surgery

## 2018-09-30 ENCOUNTER — Ambulatory Visit (INDEPENDENT_AMBULATORY_CARE_PROVIDER_SITE_OTHER): Payer: Worker's Compensation

## 2018-09-30 DIAGNOSIS — S52124D Nondisplaced fracture of head of right radius, subsequent encounter for closed fracture with routine healing: Secondary | ICD-10-CM | POA: Diagnosis not present

## 2018-09-30 NOTE — Progress Notes (Signed)
     Patient: Valerie Villarreal           Date of Birth: 1985-12-12           MRN: [id number] Visit Date: 09/30/2018 PCP: Huel Cote, MD   Assessment & Plan:  Chief Complaint:  Chief Complaint  Patient presents with  . Right Elbow - Pain, Follow-up   Visit Diagnoses:  1. Closed nondisplaced fracture of head of right radius with routine healing, subsequent encounter     Plan: Valerie Villarreal is 6 weeks status post nondisplaced radial head fracture.  She is doing well.  She never went to therapy and because her work is quite physical.  She is doing much better and does not have much pain.  Physical exam shows nearly full elbow extension and flexion.  She lacks maybe about 5 degrees in each direction.  She has full pronation supination.  She has no tenderness palpation.  She is doing very well at this point in time.  She has demonstrated radiographic and clinical healing.  She will continue to work on home exercises to try to get the last few degrees of elbow flexion and extension back but she understands that she may not be able to get all this back due to the nature of the injury.  We will release her back to full duty at this point.  Questions encouraged and answered.  Follow-up as needed.  Follow-Up Instructions: Return if symptoms worsen or fail to improve.   Orders:  Orders Placed This Encounter  Procedures  . XR Elbow 2 Views Right   No orders of the defined types were placed in this encounter.   Imaging: Xr Elbow 2 Views Right  Result Date: 09/30/2018 Healed radial head fracture   PMFS History: Patient Active Problem List   Diagnosis Date Noted  . Closed nondisplaced fracture of head of right radius 07/26/2018   Past Medical History:  Diagnosis Date  . No pertinent past medical history     History reviewed. No pertinent family history.  Past Surgical History:  Procedure Laterality Date  . NO PAST SURGERIES     Social History   Occupational History  . Not on file   Tobacco Use  . Smoking status: Current Every Day Smoker    Packs/day: 0.50    Types: Cigarettes  . Smokeless tobacco: Never Used  Substance and Sexual Activity  . Alcohol use: Yes    Comment: occasionally  . Drug use: No  . Sexual activity: Yes    Birth control/protection: I.U.D.

## 2020-02-26 IMAGING — CR DG FOREARM 2V*R*
2 series · 2 of 2 positions shown · non-contrast
Comparison: None.

CLINICAL DATA: Right forearm pain and swelling after fall.

EXAM:
RIGHT FOREARM - 2 VIEW

[x forearm ap right]
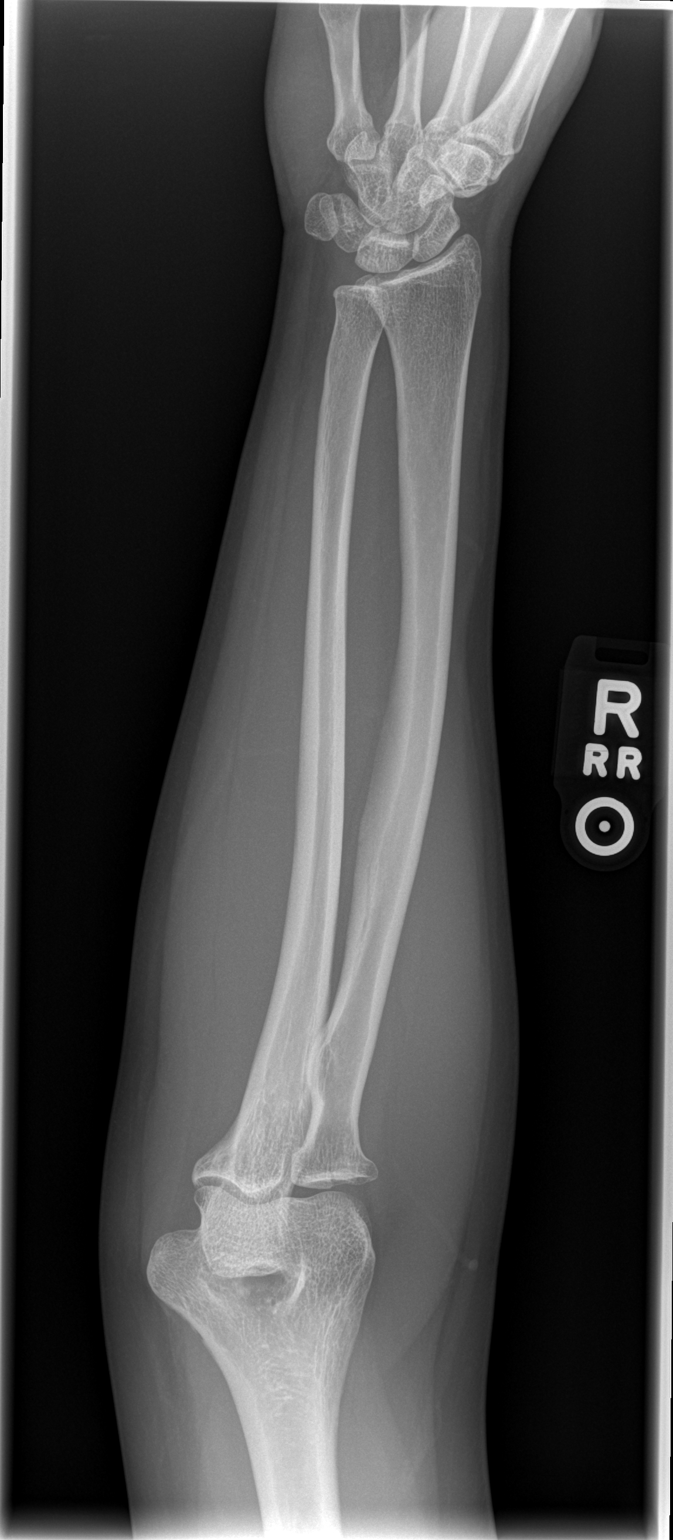

[x forearm lat right]
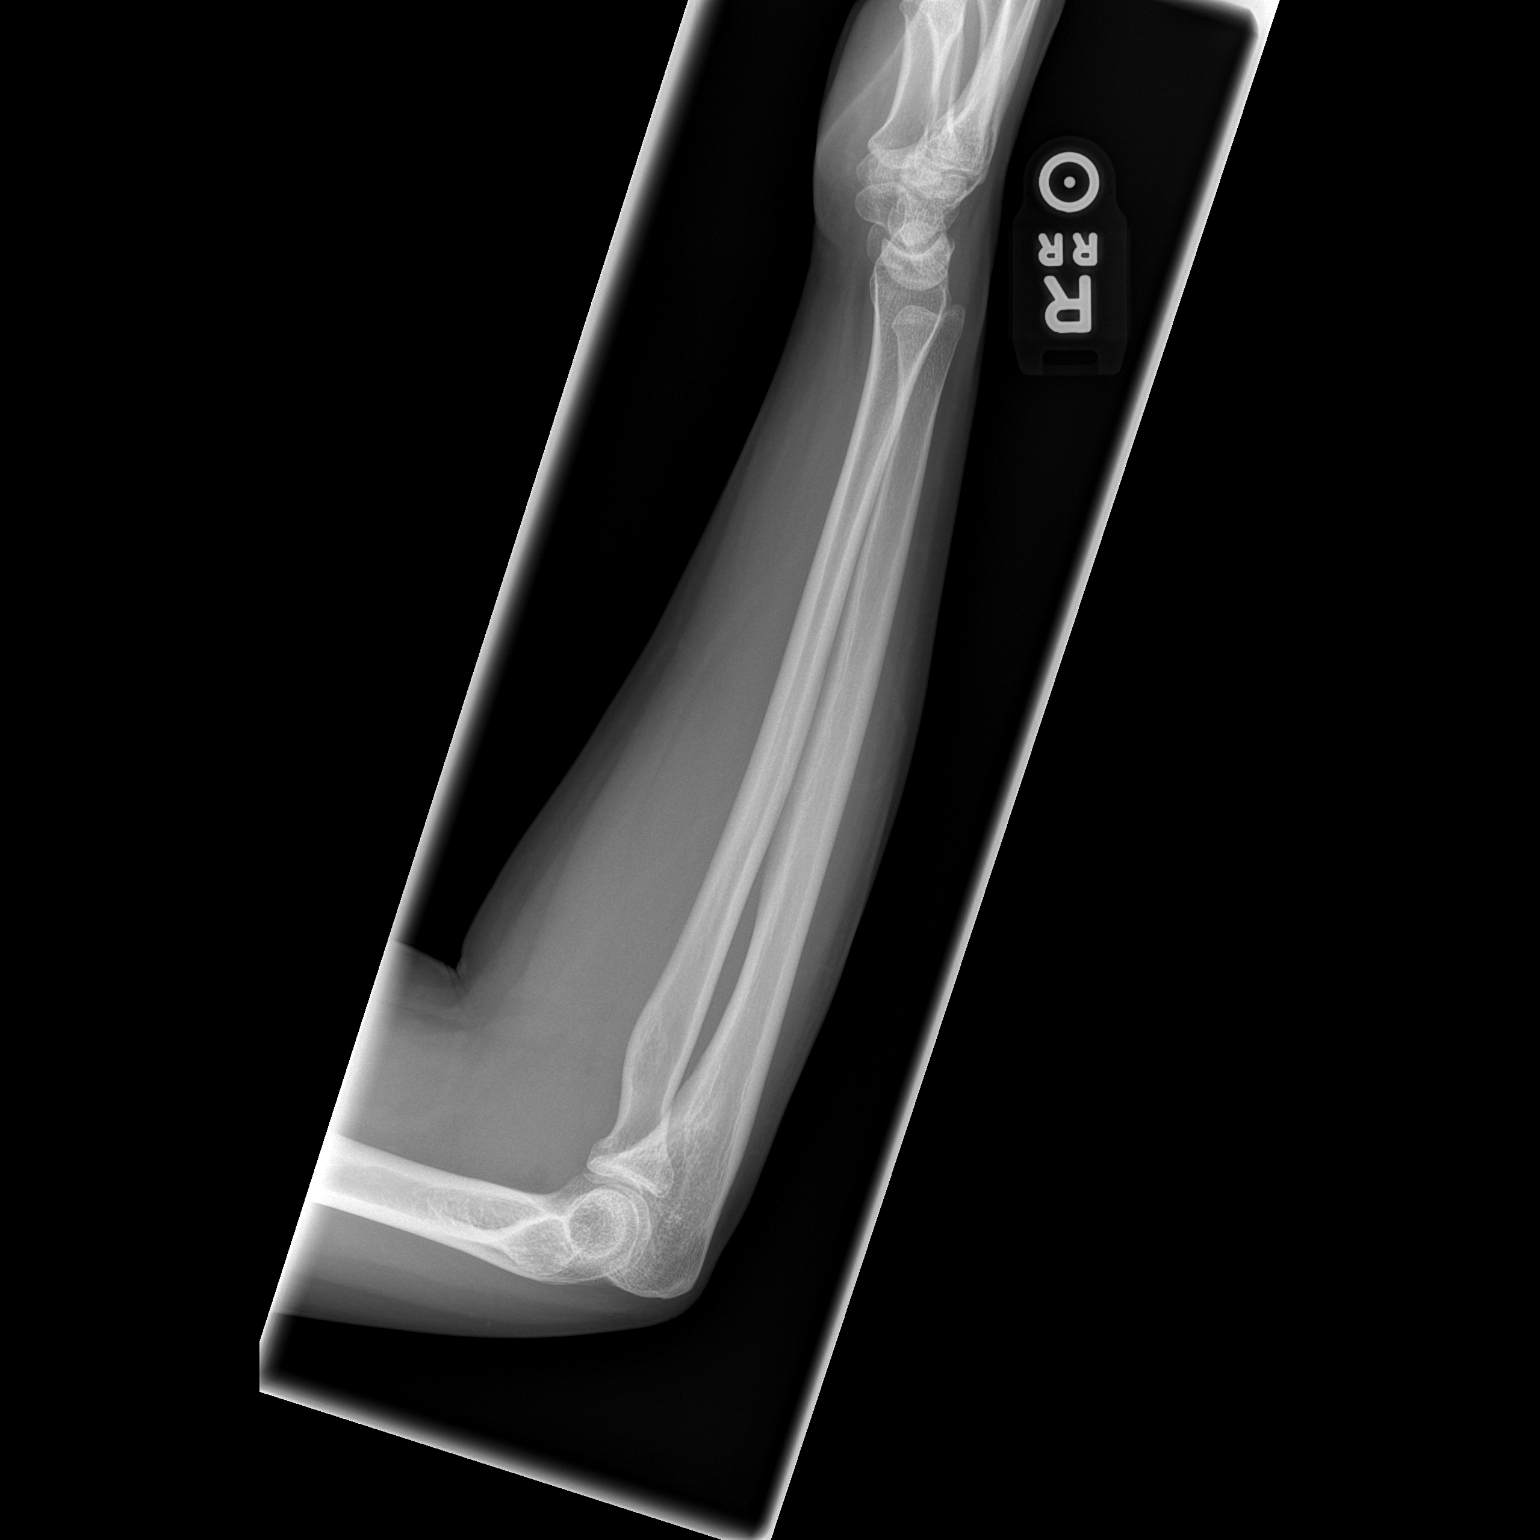

[2 of 2 positions shown; findings below may reference images not displayed]

FINDINGS: A nondisplaced centrally located radial head fracture with anterior
elevation of the fat pad consistent with an elbow joint effusion is
noted. No joint dislocation is seen. The ulna appears intact. There
is mild dorsal soft tissue swelling of the mid forearm. The included
carpal bones are intact.
IMPRESSION: Irene category I nondisplaced radial head fracture with associated
elbow joint effusion.

## 2021-05-19 ENCOUNTER — Encounter: Payer: Self-pay | Admitting: Podiatry

## 2021-05-19 ENCOUNTER — Ambulatory Visit (INDEPENDENT_AMBULATORY_CARE_PROVIDER_SITE_OTHER): Payer: BC Managed Care – PPO

## 2021-05-19 ENCOUNTER — Other Ambulatory Visit: Payer: Self-pay

## 2021-05-19 ENCOUNTER — Ambulatory Visit: Payer: BC Managed Care – PPO | Admitting: Podiatry

## 2021-05-19 DIAGNOSIS — M722 Plantar fascial fibromatosis: Secondary | ICD-10-CM

## 2021-05-19 DIAGNOSIS — M79674 Pain in right toe(s): Secondary | ICD-10-CM | POA: Diagnosis not present

## 2021-05-19 MED ORDER — TRIAMCINOLONE ACETONIDE 10 MG/ML IJ SUSP
10.0000 mg | Freq: Once | INTRAMUSCULAR | Status: AC
  Administered 2021-05-19: 09:00:00 10 mg

## 2021-05-19 NOTE — Progress Notes (Signed)
Subjective:   Patient ID: Valerie Villarreal, female   DOB: 35 y.o.   MRN: [id number]   HPI Patient states she has had a lot of pain in the under portion of her right mid arch area on the outside and also has some tingling and numbness of her fifth toe and into her fourth toe.  Does not remember injuring its been going on around 3 weeks.  Patient smokes half pack per day tries to be active   Review of Systems  All other systems reviewed and are negative.      Objective:  Physical Exam Vitals and nursing note reviewed.  Constitutional:      Appearance: She is well-developed.  Pulmonary:     Effort: Pulmonary effort is normal.  Musculoskeletal:        General: Normal range of motion.  Skin:    General: Skin is warm.  Neurological:     Mental Status: She is alert.    Neurovascular status intact muscle strength found to be adequate range of motion within normal limits.  Patient does have discomfort in the plantar lateral aspect of the fascial right and there is some slight numbness in the lesser digits but localized with no proximal issues or issues with her back.  Patient is found to have good digital perfusion well oriented     Assessment:  Lateral band fasciitis mid arch right along with slight numbness which may be related to pressure on the nerve secondary to the inflammatory condition     Plan:  H&P reviewed conditions and discussed.  At this point I have recommended injection treatment and did sterile prep and injected the lateral fascia mid arch 3 mg Kenalog 5 mg Xylocaine advised on anti-inflammatories physical therapy and reappoint to recheck as symptoms indicate also placing on diclofenac 75 mg twice daily  X-rays are negative for signs of bone spur fracture or arthritis condition associated with this

## 2021-05-20 ENCOUNTER — Other Ambulatory Visit: Payer: Self-pay | Admitting: Podiatry

## 2021-05-20 DIAGNOSIS — M722 Plantar fascial fibromatosis: Secondary | ICD-10-CM

## 2023-07-20 ENCOUNTER — Ambulatory Visit
Admission: RE | Admit: 2023-07-20 | Discharge: 2023-07-20 | Disposition: A | Discharge status: Home / Self Care | Payer: Self-pay | Source: Ambulatory Visit | Attending: Family Medicine | Admitting: Family Medicine

## 2023-07-20 VITALS — BP 116/85 | HR 76 | Temp 98.2°F | Resp 16

## 2023-07-20 DIAGNOSIS — H6991 Unspecified Eustachian tube disorder, right ear: Secondary | ICD-10-CM | POA: Diagnosis not present

## 2023-07-20 MED ORDER — PREDNISONE 20 MG PO TABS: ORAL_TABLET | ORAL | 0 refills | Status: AC

## 2023-07-20 MED ORDER — LEVOCETIRIZINE DIHYDROCHLORIDE 5 MG PO TABS: 5.0000 mg | ORAL_TABLET | Freq: Every evening | ORAL | 0 refills | Status: AC

## 2023-07-20 NOTE — ED Provider Notes (Signed)
 [facility]  Note:  This document was prepared using Conservation officer, historic buildings and may include unintentional dictation errors.  MRN: [id number] DOB: August 05, 1985  Subjective:   Valerie Villarreal is a 38 y.o. female presenting for 15-month history of persistent right ear throbbing, fluttering.  No fever, ear drainage, tinnitus, sinus symptoms.  Denies history of allergies.  No history of ear infections.  Patient is a smoker, is working on quitting, just got started with Wellbutrin.  No current facility-administered medications for this encounter.  Current Outpatient Medications:    buPROPion (WELLBUTRIN SR) 150 MG 12 hr tablet, Take 150 mg by mouth 2 (two) times daily., Disp: , Rfl:    levonorgestrel (MIRENA, 52 MG,) 20 MCG/DAY IUD, Mirena 20 mcg/24 hours (8 yrs) 52 mg intrauterine device  Take 1 device by intrauterine route., Disp: , Rfl:    No Known Allergies  Past Medical History:  Diagnosis Date   No pertinent past medical history      Past Surgical History:  Procedure Laterality Date   NO PAST SURGERIES      History reviewed. No pertinent family history.  Social History   Tobacco Use   Smoking status: Every Day    Current packs/day: 0.50    Types: Cigarettes   Smokeless tobacco: Never  Vaping Use   Vaping status: Never Used  Substance Use Topics   Alcohol use: Yes    Comment: occasionally   Drug use: No    ROS   Objective:   Vitals: BP 116/85 (BP Location: Left Arm)   Pulse 76   Temp 98.2 F (36.8 C) (Oral)   Resp 16   SpO2 96%   Physical Exam Constitutional:      General: She is not in acute distress.    Appearance: Normal appearance. She is well-developed and normal weight. She is not ill-appearing, toxic-appearing or diaphoretic.  HENT:     Head: Normocephalic and atraumatic.     Right Ear: Tympanic membrane, ear canal and external ear normal. No drainage or tenderness. No middle ear effusion. There is no impacted  cerumen. Tympanic membrane is not erythematous or bulging.     Left Ear: Tympanic membrane, ear canal and external ear normal. No drainage or tenderness.  No middle ear effusion. There is no impacted cerumen. Tympanic membrane is not erythematous or bulging.     Nose: No congestion or rhinorrhea.     Comments: Nasal mucosa boggy and edematous, violaceous.    Mouth/Throat:     Mouth: Mucous membranes are moist. No oral lesions.     Pharynx: No pharyngeal swelling, oropharyngeal exudate, posterior oropharyngeal erythema or uvula swelling.     Tonsils: No tonsillar exudate or tonsillar abscesses.  Eyes:     General: No scleral icterus.       Right eye: No discharge.        Left eye: No discharge.     Extraocular Movements: Extraocular movements intact.     Right eye: Normal extraocular motion.     Left eye: Normal extraocular motion.     Conjunctiva/sclera: Conjunctivae normal.  Cardiovascular:     Rate and Rhythm: Normal rate.  Pulmonary:     Effort: Pulmonary effort is normal.  Musculoskeletal:     Cervical back: Normal range of motion and neck supple.  Lymphadenopathy:     Cervical: No cervical adenopathy.  Skin:    General: Skin is warm and dry.  Neurological:     General: No focal  deficit present.     Mental Status: She is alert and oriented to person, place, and time.  Psychiatric:        Mood and Affect: Mood normal.        Behavior: Behavior normal.     Assessment and Plan :   PDMP not reviewed this encounter.  1. Eustachian tube dysfunction, right    Suspect eustachian tube dysfunction likely secondary to smoking.  Patient would like more aggressive management and therefore offered prednisone since she does not like nasal sprays.  I did recommend Xyzal daily.  Following the prednisone course, can switch to pseudoephedrine.  Recommend following up with an ENT if she continues to have symptoms.  Counseled patient on potential for adverse effects with medications  prescribed/recommended today, ER and return-to-clinic precautions discussed, patient verbalized understanding.    Wallis Bamberg, [place] 07/20/23 1242

## 2023-07-20 NOTE — ED Triage Notes (Signed)
 Pt states right ear throbbing and fluttering for the past 4 months.

## 2023-09-27 ENCOUNTER — Encounter (HOSPITAL_BASED_OUTPATIENT_CLINIC_OR_DEPARTMENT_OTHER): Payer: Self-pay | Admitting: *Deleted

## 2023-09-27 ENCOUNTER — Other Ambulatory Visit: Payer: Self-pay

## 2023-09-27 ENCOUNTER — Emergency Department (HOSPITAL_BASED_OUTPATIENT_CLINIC_OR_DEPARTMENT_OTHER)
Admission: EM | Admit: 2023-09-27 | Discharge: 2023-09-27 | Discharge status: Against Medical Advice | Attending: Emergency Medicine | Admitting: Emergency Medicine

## 2023-09-27 ENCOUNTER — Emergency Department (HOSPITAL_BASED_OUTPATIENT_CLINIC_OR_DEPARTMENT_OTHER)

## 2023-09-27 DIAGNOSIS — Z5321 Procedure and treatment not carried out due to patient leaving prior to being seen by health care provider: Secondary | ICD-10-CM | POA: Diagnosis not present

## 2023-09-27 DIAGNOSIS — R930 Abnormal findings on diagnostic imaging of skull and head, not elsewhere classified: Secondary | ICD-10-CM | POA: Diagnosis not present

## 2023-09-27 DIAGNOSIS — S0031XA Abrasion of nose, initial encounter: Secondary | ICD-10-CM | POA: Insufficient documentation

## 2023-09-27 DIAGNOSIS — Y92410 Unspecified street and highway as the place of occurrence of the external cause: Secondary | ICD-10-CM | POA: Diagnosis not present

## 2023-09-27 DIAGNOSIS — M542 Cervicalgia: Secondary | ICD-10-CM | POA: Insufficient documentation

## 2023-09-27 DIAGNOSIS — S0992XA Unspecified injury of nose, initial encounter: Secondary | ICD-10-CM | POA: Diagnosis present
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
## 2023-09-27 NOTE — ED Notes (Signed)
 No answer in lobby, called pt.  She is feeling fine but had to leave due to a funeral.  Advised her to come back if needed, anytime
